# Patient Record
Sex: Male | Born: 1975 | Race: Asian | Hispanic: No | Marital: Married | Smoking: Never smoker
Health system: Southern US, Community
[De-identification: ages and names within clinical notes are randomized; demographics above are authoritative.]

## PROBLEM LIST (undated history)

## (undated) DIAGNOSIS — E785 Hyperlipidemia, unspecified: Secondary | ICD-10-CM

## (undated) DIAGNOSIS — Z8639 Personal history of other endocrine, nutritional and metabolic disease: Secondary | ICD-10-CM

## (undated) HISTORY — DX: Hyperlipidemia, unspecified: E78.5

## (undated) HISTORY — DX: Personal history of other endocrine, nutritional and metabolic disease: Z86.39

---

## 2013-04-23 DIAGNOSIS — Z8639 Personal history of other endocrine, nutritional and metabolic disease: Secondary | ICD-10-CM

## 2013-04-23 HISTORY — DX: Personal history of other endocrine, nutritional and metabolic disease: Z86.39

## 2013-05-15 LAB — HEPATIC FUNCTION PANEL
ALT: 17 U/L (ref 10–40)
AST: 23 U/L (ref 14–40)
Alkaline Phosphatase: 77 U/L (ref 25–125)
BILIRUBIN, TOTAL: 0.6 mg/dL

## 2013-05-15 LAB — LIPID PANEL
Cholesterol: 220 mg/dL — AB (ref 0–200)
HDL: 60 mg/dL (ref 35–70)
LDL CALC: 150 mg/dL
TRIGLYCERIDES: 52 mg/dL (ref 40–160)

## 2013-05-15 LAB — BASIC METABOLIC PANEL
BUN: 23 mg/dL — AB (ref 4–21)
Creatinine: 1.1 mg/dL (ref <=1.3)
Glucose: 94 mg/dL
Potassium: 5.1 mmol/L (ref 3.4–5.3)
SODIUM: 140 mmol/L (ref 137–147)

## 2013-05-15 LAB — CBC AND DIFFERENTIAL
HEMATOCRIT: 45 % (ref 41–53)
Hemoglobin: 14.8 g/dL (ref 13.5–17.5)
PLATELETS: 170 10*3/uL (ref 150–399)
WBC: 6.6 10^3/mL

## 2013-05-15 LAB — VITAMIN D 25 HYDROXY (VIT D DEFICIENCY, FRACTURES): Vit D, 25-Hydroxy: 17.2

## 2013-05-15 LAB — TSH: TSH: 0.83 u[IU]/mL (ref <=5.90)

## 2015-07-26 DIAGNOSIS — E785 Hyperlipidemia, unspecified: Secondary | ICD-10-CM | POA: Insufficient documentation

## 2016-09-11 ENCOUNTER — Encounter: Payer: Self-pay | Admitting: *Deleted

## 2016-09-11 ENCOUNTER — Other Ambulatory Visit: Payer: Self-pay | Admitting: *Deleted

## 2016-09-13 ENCOUNTER — Encounter: Payer: Self-pay | Admitting: Family Medicine

## 2016-09-14 ENCOUNTER — Ambulatory Visit (INDEPENDENT_AMBULATORY_CARE_PROVIDER_SITE_OTHER): Payer: 59 | Admitting: Family Medicine

## 2016-09-14 ENCOUNTER — Encounter: Payer: Self-pay | Admitting: Family Medicine

## 2016-09-14 VITALS — BP 103/67 | HR 68 | Temp 97.9°F | Resp 16 | Ht 69.5 in | Wt 166.8 lb

## 2016-09-14 DIAGNOSIS — D3610 Benign neoplasm of peripheral nerves and autonomic nervous system, unspecified: Secondary | ICD-10-CM

## 2016-09-14 NOTE — Progress Notes (Signed)
Office Note 09/14/2016  CC:  Chief Complaint  Patient presents with  . Establish Care  . Cyst    ? on chest and side per pt   HPI:  Bob Bennett is a 41 y.o. male who is here to establish care and discuss question of cyst on chest and side. Patient's most recent primary MD: Dr. Tammi Klippel at [facility] in [place]. Old records were reviewed (some old labs) prior to or during today's visit.  In 2014-2015 he took about 1 mo of daily vit D--unknown dose-- then stopped.  Never got it rechecked.  He feels a hard bump at xyphoid process --more the last month than prior. Also, mildly firm bump felt in L side/thoracic level noted about 2-3 weeks ago.  No pain.    Past Medical History:  Diagnosis Date  . History of vitamin D deficiency   . Hyperlipidemia     History reviewed. No pertinent surgical history.  Family History  Problem Relation Age of Onset  . Diabetes Father   . Cancer Neg Hx   . Heart disease Neg Hx   . Stroke Neg Hx     Social History   Social History  . Marital status: Married    Spouse name: N/A  . Number of children: N/A  . Years of education: N/A   Occupational History  . Not on file.   Social History Main Topics  . Smoking status: Never Smoker  . Smokeless tobacco: Never Used  . Alcohol use Yes     Comment: occasionally  . Drug use: No  . Sexual activity: Not on file   Other Topics Concern  . Not on file   Social History Narrative   Married, 1 son and 1 daughter.   Orig from [place].  Moved to [place] in 2000.   Educ: Masters degree in Chief Financial Officer.   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   No tobacco.  No drugs.   Alcohol: occasional.    MEDS: none  No Known Allergies  ROS Review of Systems  Constitutional: Negative for fatigue and fever.  HENT: Negative for congestion and sore throat.   Eyes: Negative for visual disturbance.  Respiratory: Negative for cough.   Cardiovascular: Negative for chest pain.  Gastrointestinal:  Negative for abdominal pain and nausea.  Genitourinary: Negative for dysuria.  Musculoskeletal: Negative for back pain and joint swelling.  Skin: Negative for rash.  Neurological: Negative for weakness and headaches.  Hematological: Negative for adenopathy.    PE; Blood pressure 103/67, pulse 68, temperature 97.9 F (36.6 C), temperature source Oral, resp. rate 16, height 5' 9.5" (1.765 m), weight 166 lb 12 oz (75.6 kg), SpO2 98 %. Gen: Alert, well appearing.  Patient is oriented to person, place, time, and situation. AFFECT: pleasant, lucid thought and speech. EKC:MKLK: no injection, icteris, swelling, or exudate.  EOMI, PERRLA. Mouth: lips without lesion/swelling.  Oral mucosa pink and moist. Oropharynx without erythema, exudate, or swelling.  CV: RRR, no m/r/g.   LUNGS: CTA bilat, nonlabored resps, good aeration in all lung fields. Chest wall: xyphoid process is palpable, nontender. Left posterolateral chest wall, overlying inferior-most rib is a 1-2 cm soft tissue nodule that feels like it is in the fatty layer and is rubbery consistency and freely moveable, non-tender.  No overlying skin changes.  Pertinent labs:  none  ASSESSMENT AND PLAN:   New pt; pertinent old records reviewed.  1) Xiphoid "nodule"--reassured patient that this was his xyphoid process/normal anatomy, not a cyst, nodule,  or mass.  2) Soft tissue nodule--left posterolateral chest wall.  Suspect benign--? Neuroma, ? Sebaceous cyst.  Doubt lipoma. Reassured pt--watchful waiting approach.  3) Hx of vit D deficiency and mild hyperlipidemia : we'll recheck vit D level with fasting health panel labs at upcoming CPE.  An After Visit Summary was printed and given to the patient.  Return for keep appt for CPE (fasting) 09/19/16.  Signed:  Crissie Sickles, MD           09/14/2016

## 2016-09-19 ENCOUNTER — Encounter: Payer: Self-pay | Admitting: Family Medicine

## 2016-10-08 ENCOUNTER — Encounter: Payer: Self-pay | Admitting: Family Medicine

## 2016-10-30 ENCOUNTER — Encounter: Payer: Self-pay | Admitting: Family Medicine

## 2016-10-30 ENCOUNTER — Encounter: Payer: 59 | Admitting: Family Medicine

## 2016-10-30 ENCOUNTER — Encounter: Payer: Self-pay | Admitting: *Deleted

## 2016-10-30 NOTE — Progress Notes (Deleted)
Office Note 10/30/2016  CC: No chief complaint on file.   HPI:  Bob Bennett is a 41 y.o.  male who is here for annual health maintenance exam.    Past Medical History:  Diagnosis Date  . History of vitamin D deficiency 2015  . Hyperlipidemia    Mild    No past surgical history on file.  Family History  Problem Relation Age of Onset  . Diabetes Father   . Cancer Neg Hx   . Heart disease Neg Hx   . Stroke Neg Hx     Social History   Social History  . Marital status: Married    Spouse name: N/A  . Number of children: N/A  . Years of education: N/A   Occupational History  . Not on file.   Social History Main Topics  . Smoking status: Never Smoker  . Smokeless tobacco: Never Used  . Alcohol use Yes     Comment: occasionally  . Drug use: No  . Sexual activity: Not on file   Other Topics Concern  . Not on file   Social History Narrative   Married, 1 son and 1 daughter.   Orig from [place].  Moved to [place] in 2000.   Educ: Masters degree in Chief Financial Officer.   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   No tobacco.  No drugs.   Alcohol: occasional.    No outpatient prescriptions prior to visit.   No facility-administered medications prior to visit.     No Known Allergies  ROS *** PE; There were no vitals taken for this visit. *** Pertinent labs:  ***  ASSESSMENT AND PLAN:   No problem-specific Assessment & Plan notes found for this encounter.   FOLLOW UP:  No Follow-up on file.

## 2016-11-30 ENCOUNTER — Ambulatory Visit (INDEPENDENT_AMBULATORY_CARE_PROVIDER_SITE_OTHER): Payer: 59 | Admitting: Family Medicine

## 2016-11-30 ENCOUNTER — Encounter: Payer: Self-pay | Admitting: Family Medicine

## 2016-11-30 VITALS — BP 92/58 | HR 70 | Temp 97.9°F | Resp 16 | Ht 69.5 in | Wt 166.5 lb

## 2016-11-30 DIAGNOSIS — Z131 Encounter for screening for diabetes mellitus: Secondary | ICD-10-CM | POA: Diagnosis not present

## 2016-11-30 DIAGNOSIS — Z0000 Encounter for general adult medical examination without abnormal findings: Secondary | ICD-10-CM

## 2016-11-30 LAB — CBC WITH DIFFERENTIAL/PLATELET
BASOS ABS: 0 10*3/uL (ref 0.0–0.1)
BASOS PCT: 0.7 % (ref 0.0–3.0)
EOS PCT: 0.6 % (ref 0.0–5.0)
Eosinophils Absolute: 0 10*3/uL (ref 0.0–0.7)
HEMATOCRIT: 42.4 % (ref 39.0–52.0)
Hemoglobin: 13.8 g/dL (ref 13.0–17.0)
LYMPHS ABS: 1.6 10*3/uL (ref 0.7–4.0)
LYMPHS PCT: 26.3 % (ref 12.0–46.0)
MCHC: 32.6 g/dL (ref 30.0–36.0)
MCV: 90.6 fl (ref 78.0–100.0)
MONOS PCT: 7.9 % (ref 3.0–12.0)
Monocytes Absolute: 0.5 10*3/uL (ref 0.1–1.0)
NEUTROS ABS: 3.9 10*3/uL (ref 1.4–7.7)
NEUTROS PCT: 64.5 % (ref 43.0–77.0)
PLATELETS: 206 10*3/uL (ref 150.0–400.0)
RBC: 4.68 Mil/uL (ref 4.22–5.81)
RDW: 13.6 % (ref 11.5–15.5)
WBC: 6.1 10*3/uL (ref 4.0–10.5)

## 2016-11-30 LAB — COMPREHENSIVE METABOLIC PANEL
ALT: 33 U/L (ref 0–53)
AST: 40 U/L — AB (ref 0–37)
Albumin: 4.2 g/dL (ref 3.5–5.2)
Alkaline Phosphatase: 55 U/L (ref 39–117)
BUN: 21 mg/dL (ref 6–23)
CALCIUM: 9.3 mg/dL (ref 8.4–10.5)
CHLORIDE: 104 meq/L (ref 96–112)
CO2: 30 meq/L (ref 19–32)
CREATININE: 1.07 mg/dL (ref 0.40–1.50)
GFR: 81.32 mL/min (ref >60.00)
GLUCOSE: 94 mg/dL (ref 70–99)
Potassium: 4.5 mEq/L (ref 3.5–5.1)
Sodium: 137 mEq/L (ref 135–145)
Total Bilirubin: 1 mg/dL (ref 0.2–1.2)
Total Protein: 6.7 g/dL (ref 6.0–8.3)

## 2016-11-30 LAB — TSH: TSH: 0.82 u[IU]/mL (ref 0.35–4.50)

## 2016-11-30 LAB — LIPID PANEL
CHOL/HDL RATIO: 4
Cholesterol: 205 mg/dL — ABNORMAL HIGH (ref 0–200)
HDL: 45.7 mg/dL (ref >39.00)
LDL CALC: 142 mg/dL — AB (ref 0–99)
NONHDL: 159.15
TRIGLYCERIDES: 85 mg/dL (ref 0.0–149.0)
VLDL: 17 mg/dL (ref 0.0–40.0)

## 2016-11-30 LAB — HEMOGLOBIN A1C: Hgb A1c MFr Bld: 5.8 % (ref 4.6–6.5)

## 2016-11-30 NOTE — Progress Notes (Signed)
Office Note 11/30/2016  CC:  Chief Complaint  Patient presents with  . Annual Exam    Pt is fasting.    HPI:  Bob Bennett is a 41 y.o. male who is here for annual health maintenance exam. Has biometrics form for his employer that he needs to have me complete when his lab results return. He is feeling well and has no acute complaints.  Eyes: had exam about 10 yrs ago. Dental: last preventative exam was 2 yrs ago. Exercise: started crossfit workouts recently. Diet: tries to eat varied food choices, limits portion size, tries to reduce carbs.   Past Medical History:  Diagnosis Date  . History of vitamin D deficiency 2015  . Hyperlipidemia    Mild    History reviewed. No pertinent surgical history.  Family History  Problem Relation Age of Onset  . Diabetes Father   . Cancer Neg Hx   . Heart disease Neg Hx   . Stroke Neg Hx     Social History   Social History  . Marital status: Married    Spouse name: N/A  . Number of children: N/A  . Years of education: N/A   Occupational History  . Not on file.   Social History Main Topics  . Smoking status: Never Smoker  . Smokeless tobacco: Never Used  . Alcohol use Yes     Comment: occasionally  . Drug use: No  . Sexual activity: Not on file   Other Topics Concern  . Not on file   Social History Narrative   Married, 1 son and 1 daughter.   Orig from [place].  Moved to [place] in 2000.   Educ: Masters degree in Chief Financial Officer.   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   No tobacco.  No drugs.   Alcohol: occasional.    MEDS; none  No Known Allergies  ROS Review of Systems  Constitutional: Negative for appetite change, chills, fatigue and fever.  HENT: Negative for congestion, dental problem, ear pain and sore throat.   Eyes: Negative for discharge, redness and visual disturbance.  Respiratory: Negative for cough, chest tightness, shortness of breath and wheezing.   Cardiovascular: Negative for chest pain,  palpitations and leg swelling.  Gastrointestinal: Negative for abdominal pain, blood in stool, diarrhea, nausea and vomiting.  Genitourinary: Negative for difficulty urinating, dysuria, flank pain, frequency, hematuria and urgency.  Musculoskeletal: Negative for arthralgias, back pain, joint swelling, myalgias and neck stiffness.  Skin: Negative for pallor and rash.  Neurological: Negative for dizziness, speech difficulty, weakness and headaches.  Hematological: Negative for adenopathy. Does not bruise/bleed easily.  Psychiatric/Behavioral: Negative for confusion and sleep disturbance. The patient is not nervous/anxious.     PE; Blood pressure (!) 92/58, pulse 70, temperature 97.9 F (36.6 C), temperature source Oral, resp. rate 16, height 5' 9.5" (1.765 m), weight 166 lb 8 oz (75.5 kg), SpO2 98 %. Body mass index is 24.24 kg/m.  Gen: Alert, well appearing.  Patient is oriented to person, place, time, and situation. AFFECT: pleasant, lucid thought and speech. ENT: Ears: EACs clear, normal epithelium.  TMs with good light reflex and landmarks bilaterally.  Eyes: no injection, icteris, swelling, or exudate.  EOMI, PERRLA. Nose: no drainage or turbinate edema/swelling.  No injection or focal lesion.  Mouth: lips without lesion/swelling.  Oral mucosa pink and moist.  Dentition intact and without obvious caries or gingival swelling.  Oropharynx without erythema, exudate, or swelling.  Neck: supple/nontender.  No LAD, mass, or TM.  Carotid  pulses 2+ bilaterally, without bruits. CV: RRR, no m/r/g.   LUNGS: CTA bilat, nonlabored resps, good aeration in all lung fields. ABD: soft, NT, ND, BS normal.  No hepatospenomegaly or mass.  No bruits. EXT: no clubbing, cyanosis, or edema.  Musculoskeletal: no joint swelling, erythema, warmth, or tenderness.  ROM of all joints intact. Skin - no sores or suspicious lesions or rashes or color changes  Pertinent labs:  Lab Results  Component Value Date   TSH  0.83 05/15/2013   Lab Results  Component Value Date   WBC 6.6 05/15/2013   HGB 14.8 05/15/2013   HCT 45 05/15/2013   PLT 170 05/15/2013   Lab Results  Component Value Date   CREATININE 1.1 05/15/2013   BUN 23 (A) 05/15/2013   NA 140 05/15/2013   K 5.1 05/15/2013   Lab Results  Component Value Date   ALT 17 05/15/2013   AST 23 05/15/2013   ALKPHOS 77 05/15/2013   Lab Results  Component Value Date   CHOL 220 (A) 05/15/2013   Lab Results  Component Value Date   HDL 60 05/15/2013   Lab Results  Component Value Date   LDLCALC 150 05/15/2013   Lab Results  Component Value Date   TRIG 52 05/15/2013    ASSESSMENT AND PLAN:   Health maintenance exam: Reviewed age and gender appropriate health maintenance issues (prudent diet, regular exercise, health risks of tobacco and excessive alcohol, use of seatbelts, fire alarms in home, use of sunscreen).  Also reviewed age and gender appropriate health screening as well as vaccine recommendations. Vaccines: he declined Tdap today. Labs: fasting HP today + hb a1c as required by his employer for biometrics screening.  An After Visit Summary was printed and given to the patient.  FOLLOW UP:  Return in about 1 year (around 11/30/2017) for annual CPE (fasting).  Signed:  Crissie Sickles, MD           11/30/2016

## 2016-11-30 NOTE — Patient Instructions (Signed)

## 2017-06-11 ENCOUNTER — Ambulatory Visit (INDEPENDENT_AMBULATORY_CARE_PROVIDER_SITE_OTHER): Payer: 59 | Admitting: Family Medicine

## 2017-06-11 ENCOUNTER — Encounter: Payer: Self-pay | Admitting: Family Medicine

## 2017-06-11 VITALS — BP 102/64 | HR 81 | Temp 98.4°F | Resp 16 | Ht 69.5 in | Wt 164.0 lb

## 2017-06-11 DIAGNOSIS — R768 Other specified abnormal immunological findings in serum: Secondary | ICD-10-CM

## 2017-06-11 NOTE — Progress Notes (Signed)
OFFICE VISIT  06/11/2017   CC:  Chief Complaint  Patient presents with  . Exposure to STD    ?    HPI:    Patient is a 42 y.o.  male who presents for concern about syphilis. On 05/08/17 [facility], testing on his blood showed + RPR but NEG FTA confirmatory testing. He denies any known exposure to any STD, nor does he have past hx of unprotected sex. He has been sexually active exclusively with his wife and does not use protection.  Says wife donated blood approx 2 yrs ago and no positive testing showed up on her blood.  Denies any hx of penile/GU lesion.  No hx of unexplained rash.  I last saw him 11/2016 for health maintenance exam.  He was doing well, routine HP labs + HbA1c all normal.  Past Medical History:  Diagnosis Date  . History of vitamin D deficiency 2015  . Hyperlipidemia    Mild   Social History   Socioeconomic History  . Marital status: Married    Spouse name: None  . Number of children: None  . Years of education: None  . Highest education level: None  Social Needs  . Financial resource strain: None  . Food insecurity - worry: None  . Food insecurity - inability: None  . Transportation needs - medical: None  . Transportation needs - non-medical: None  Occupational History  . None  Tobacco Use  . Smoking status: Never Smoker  . Smokeless tobacco: Never Used  Substance and Sexual Activity  . Alcohol use: Yes    Comment: occasionally  . Drug use: No  . Sexual activity: None  Other Topics Concern  . None  Social History Narrative   Married, 1 son and 1 daughter.   Orig from [place].  Moved to [place] in 2000.   Educ: Masters degree in Chief Financial Officer.   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   No tobacco.  No drugs.   Alcohol: occasional.    History reviewed. No pertinent surgical history.  No outpatient medications prior to visit.   No facility-administered medications prior to visit.     No Known Allergies  ROS As per  HPI  PE: Blood pressure 102/64, pulse 81, temperature 98.4 F (36.9 C), temperature source Oral, resp. rate 16, height 5' 9.5" (1.765 m), weight 164 lb (74.4 kg), SpO2 97 %. Gen: Alert, well appearing.  Patient is oriented to person, place, time, and situation. AFFECT: pleasant, lucid thought and speech. No further exam today.  LABS:  None today.  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 Will repeat RPR and FTA testing here today. Pt very low risk for this dz. Reassurance given today.  An After Visit Summary was printed and given to the patient.  FOLLOW UP: Return for as needed.  Signed:  Crissie Sickles, MD           06/11/2017

## 2017-06-12 LAB — RPR: RPR: NONREACTIVE

## 2017-06-18 ENCOUNTER — Encounter: Payer: Self-pay | Admitting: *Deleted

## 2017-09-05 ENCOUNTER — Telehealth: Payer: Self-pay | Admitting: Family Medicine

## 2017-09-05 NOTE — Telephone Encounter (Signed)
Patient dropped "pre-participation physical" form off requesting it be completed by pcp.  Patient has been advised pcp out of office until next week.  Patient states this is ok as form is not due until end of the month.   Form placed in Dr. Idelle Leech folder at the front desk.

## 2017-09-05 NOTE — Telephone Encounter (Signed)
Pt called back. Pt stated that he would like to use the 11/30/16 visit to complete his health form.   Form placed on Dr. Idelle Leech desk for review.

## 2017-09-05 NOTE — Telephone Encounter (Addendum)
Left message for pt to call back.   Need to know if pt is planing on coming in for a CPE or does he want [place] to fill out the form based off his CPE done 11/30/16?  If pts insurance is still [employer] he can schedule a CPE anytime does not have to be a year from last CPE.

## 2017-09-10 NOTE — Telephone Encounter (Signed)
Form completed.

## 2017-09-10 NOTE — Telephone Encounter (Signed)
Copy made for chart.   Original put up front for pick up.   Left detailed message on home vm, okay per DPR.

## 2017-11-26 ENCOUNTER — Encounter: Payer: Self-pay | Admitting: Family Medicine

## 2017-11-26 ENCOUNTER — Ambulatory Visit (INDEPENDENT_AMBULATORY_CARE_PROVIDER_SITE_OTHER): Payer: 59 | Admitting: Family Medicine

## 2017-11-26 VITALS — BP 121/73 | HR 88 | Temp 99.2°F | Resp 20 | Ht 69.5 in | Wt 165.0 lb

## 2017-11-26 DIAGNOSIS — J40 Bronchitis, not specified as acute or chronic: Secondary | ICD-10-CM

## 2017-11-26 DIAGNOSIS — R509 Fever, unspecified: Secondary | ICD-10-CM | POA: Diagnosis not present

## 2017-11-26 DIAGNOSIS — R591 Generalized enlarged lymph nodes: Secondary | ICD-10-CM

## 2017-11-26 DIAGNOSIS — R059 Cough, unspecified: Secondary | ICD-10-CM

## 2017-11-26 DIAGNOSIS — R05 Cough: Secondary | ICD-10-CM

## 2017-11-26 LAB — CBC WITH DIFFERENTIAL/PLATELET
Basophils Absolute: 0 10*3/uL (ref 0.0–0.1)
Basophils Relative: 0.6 % (ref 0.0–3.0)
EOS PCT: 0.6 % (ref 0.0–5.0)
Eosinophils Absolute: 0 10*3/uL (ref 0.0–0.7)
HCT: 44.8 % (ref 39.0–52.0)
Hemoglobin: 15.2 g/dL (ref 13.0–17.0)
LYMPHS ABS: 1.2 10*3/uL (ref 0.7–4.0)
Lymphocytes Relative: 18.9 % (ref 12.0–46.0)
MCHC: 33.8 g/dL (ref 30.0–36.0)
MCV: 89.1 fl (ref 78.0–100.0)
MONO ABS: 0.9 10*3/uL (ref 0.1–1.0)
MONOS PCT: 13.6 % — AB (ref 3.0–12.0)
NEUTROS ABS: 4.3 10*3/uL (ref 1.4–7.7)
NEUTROS PCT: 66.3 % (ref 43.0–77.0)
PLATELETS: 193 10*3/uL (ref 150.0–400.0)
RBC: 5.03 Mil/uL (ref 4.22–5.81)
RDW: 13.6 % (ref 11.5–15.5)
WBC: 6.5 10*3/uL (ref 4.0–10.5)

## 2017-11-26 LAB — POCT RAPID STREP A (OFFICE): Rapid Strep A Screen: NEGATIVE

## 2017-11-26 MED ORDER — DOXYCYCLINE HYCLATE 100 MG PO TABS: 100.0000 mg | ORAL_TABLET | Freq: Two times a day (BID) | ORAL | 0 refills | Status: AC

## 2017-11-26 MED ORDER — BENZONATATE 200 MG PO CAPS: 200.0000 mg | ORAL_CAPSULE | Freq: Three times a day (TID) | ORAL | 0 refills | Status: AC | PRN

## 2017-11-26 NOTE — Patient Instructions (Signed)
Rest, hydrate.  + flonase, mucinex (DM if cough) doxycyline prescribed, take until completed.  If cough present it can last up to 6-8 weeks.  F/U 2 weeks of not improved Advil and/or  Tylenol for aches and fevers  Right now, We will call it bronchitis treatment. However, I do want to rule out Pneumonia with such a high temperature and fatigue.    Acute Bronchitis, Adult Acute bronchitis is when air tubes (bronchi) in the lungs suddenly get swollen. The condition can make it hard to breathe. It can also cause these symptoms:  A cough.  Coughing up clear, yellow, or green mucus.  Wheezing.  Chest congestion.  Shortness of breath.  A fever.  Body aches.  Chills.  A sore throat.  Follow these instructions at home: Medicines  Take over-the-counter and prescription medicines only as told by your doctor.  If you were prescribed an antibiotic medicine, take it as told by your doctor. Do not stop taking the antibiotic even if you start to feel better. General instructions  Rest.  Drink enough fluids to keep your pee (urine) clear or pale yellow.  Avoid smoking and secondhand smoke. If you smoke and you need help quitting, ask your doctor. Quitting will help your lungs heal faster.  Use an inhaler, cool mist vaporizer, or humidifier as told by your doctor.  Keep all follow-up visits as told by your doctor. This is important. How is this prevented? To lower your risk of getting this condition again:  Wash your hands often with soap and water. If you cannot use soap and water, use hand sanitizer.  Avoid contact with people who have cold symptoms.  Try not to touch your hands to your mouth, nose, or eyes.  Make sure to get the flu shot every year.  Contact a doctor if:  Your symptoms do not get better in 2 weeks. Get help right away if:  You cough up blood.  You have chest pain.  You have very bad shortness of breath.  You become dehydrated.  You faint (pass  out) or keep feeling like you are going to pass out.  You keep throwing up (vomiting).  You have a very bad headache.  Your fever or chills gets worse. This information is not intended to replace advice given to you by your health care provider. Make sure you discuss any questions you have with your health care provider. Document Released: 09/26/2007 Document Revised: 11/16/2015 Document Reviewed: 09/28/2015 Elsevier Interactive Patient Education  [employer].

## 2017-11-26 NOTE — Progress Notes (Signed)
Bob Bennett , Jun 26, 1975, 42 y.o., male MRN: [id number] Patient Care Team    Relationship Specialty Notifications Start End  McGowen, Adrian Blackwater, MD PCP - General Family Medicine  09/14/16     Chief Complaint  Patient presents with  . Fever    fatigue,cough     Subjective: Pt presents for an OV with complaints of fatigue-cough of 4 days duration.  Associated symptoms include fever tmax of 104 F. Pt reports he has been taking tylenol every 6 hours. He endorses a non-productive cough, increase phlegm prdx, body aches, chills and  Mild abd discomfort without bowel changes.  He reports he is able to eat and drink well.  He denies headache, sore throat, sinus pressure, ear pain, rash or shortness of breath.  He has not been around any sick contacts and no recent travel. He states his wife was ill with bronchitis like symptoms, but that was over a month ago.   Depression screen PHQ 2/9 09/14/2016  Decreased Interest 0  Down, Depressed, Hopeless 0  PHQ - 2 Score 0    No Known Allergies Social History   Tobacco Use  . Smoking status: Never Smoker  . Smokeless tobacco: Never Used  Substance Use Topics  . Alcohol use: Yes    Comment: occasionally   Past Medical History:  Diagnosis Date  . History of vitamin D deficiency 2015  . Hyperlipidemia    Mild   History reviewed. No pertinent surgical history. Family History  Problem Relation Age of Onset  . Diabetes Father   . Cancer Neg Hx   . Heart disease Neg Hx   . Stroke Neg Hx    Allergies as of 11/26/2017   No Known Allergies     Medication List        Accurate as of 11/26/17  6:16 PM. Always use your most recent med list.          benzonatate 200 MG capsule Commonly known as:  TESSALON Take 1 capsule (200 mg total) by mouth 3 (three) times daily as needed for cough.   doxycycline 100 MG tablet Commonly known as:  VIBRA-TABS Take 1 tablet (100 mg total) by mouth 2 (two) times daily.       All past medical  history, surgical history, allergies, family history, immunizations andmedications were updated in the EMR today and reviewed under the history and medication portions of their EMR.     ROS: Negative, with the exception of above mentioned in HPI   Objective:  BP 121/73 (BP Location: Right Arm, Patient Position: Sitting, Cuff Size: Normal)   Pulse 88   Temp 99.2 F (37.3 C)   Resp 20   Ht 5' 9.5" (1.765 m)   Wt 165 lb (74.8 kg)   SpO2 97%   BMI 24.02 kg/m  Body mass index is 24.02 kg/m. Gen: Afebrile. No acute distress. Nontoxic in appearance, well developed, well nourished.  HENT: AT. [place]. Bilateral TM visualized w/out erythema or fullness. MMM, no oral lesions. Bilateral nares with mild erythema, no drainage or swelling. Throat without erythema or exudates. Cough and hoarseness present. Moderate phlegm present.   Eyes:Pupils Equal Round Reactive to light, Extraocular movements intact,  Conjunctiva without redness, discharge or icterus. Neck/lymp/endocrine: Supple,left anterior cervical  Lymphadenopathy present CV: RRR  Chest: CTAB, no wheeze or crackles. Good air movement, normal resp effort.  Abd: Soft.NTND. BS presnet. no Masses palpated. No rebound or guarding.  Skin: no rashes, purpura or petechiae.  Neuro:  Normal gait. PERLA. EOMi. Alert. Oriented x3  Psych: Normal affect, dress and demeanor. Normal speech. Normal thought content and judgment.  No exam data present No results found. Results for orders placed or performed in visit on 11/26/17 (from the past 24 hour(s))  POCT rapid strep A     Status: Normal   Collection Time: 11/26/17  2:20 PM  Result Value Ref Range   Rapid Strep A Screen Negative Negative  CBC w/Diff     Status: Abnormal   Collection Time: 11/26/17  2:32 PM  Result Value Ref Range   WBC 6.5 4.0 - 10.5 K/uL   RBC 5.03 4.22 - 5.81 Mil/uL   Hemoglobin 15.2 13.0 - 17.0 g/dL   HCT 44.8 39.0 - 52.0 %   MCV 89.1 78.0 - 100.0 fl   MCHC 33.8 30.0 - 36.0 g/dL    RDW 13.6 11.5 - 15.5 %   Platelets 193.0 150.0 - 400.0 K/uL   Neutrophils Relative % 66.3 43.0 - 77.0 %   Lymphocytes Relative 18.9 12.0 - 46.0 %   Monocytes Relative 13.6 (H) 3.0 - 12.0 %   Eosinophils Relative 0.6 0.0 - 5.0 %   Basophils Relative 0.6 0.0 - 3.0 %   Neutro Abs 4.3 1.4 - 7.7 K/uL   Lymphs Abs 1.2 0.7 - 4.0 K/uL   Monocytes Absolute 0.9 0.1 - 1.0 K/uL   Eosinophils Absolute 0.0 0.0 - 0.7 K/uL   Basophils Absolute 0.0 0.0 - 0.1 K/uL    Assessment/Plan: Bob Bennett is a 42 y.o. male present for OV for  Fever, unspecified fever cause/cough/lymphadenopathy - uncertain cause, high fever reported (104F)--> low grade fever today with use of tylenol q 6. Strep negative. Exam + for left ant cervical lymph node, cough, hoarseness and increase phlegm prdx. Discussed options today, and decided to treat as bronchitis w/ doxy, obtain CBC and CXR--> mostly 2/2 to high reported fever.  - POCT rapid strep A--> negative - CBC w/Diff - DG Chest 2 View; Future - Upper Respiratory Culture Neg strep--> resp culture sent.  CXR ordered.  Rest, hydrate.  + flonase, mucinex (DM if cough), nettie pot or nasal saline.  doxycyline prescribed, take until completed.  If cough present it can last up to 6-8 weeks.  F/U 2 weeks of not improved.    Reviewed expectations re: course of current medical issues.  Discussed self-management of symptoms.  Outlined signs and symptoms indicating need for more acute intervention.  Patient verbalized understanding and all questions were answered.  Patient received an After-Visit Summary.    Orders Placed This Encounter  Procedures  . Upper Respiratory Culture  . DG Chest 2 View  . CBC w/Diff  . POCT rapid strep A     Note is dictated utilizing voice recognition software. Although note has been proof read prior to signing, occasional typographical errors still can be missed. If any questions arise, please do not hesitate to call for  verification.   electronically signed by:  Howard Pouch, DO  [place]

## 2017-11-27 ENCOUNTER — Telehealth: Payer: Self-pay | Admitting: *Deleted

## 2017-11-27 ENCOUNTER — Ambulatory Visit (HOSPITAL_BASED_OUTPATIENT_CLINIC_OR_DEPARTMENT_OTHER)
Admission: RE | Admit: 2017-11-27 | Discharge: 2017-11-27 | Disposition: A | Discharge status: Home / Self Care | Payer: 59 | Source: Ambulatory Visit | Attending: Family Medicine | Admitting: Family Medicine

## 2017-11-27 DIAGNOSIS — R05 Cough: Secondary | ICD-10-CM | POA: Diagnosis not present

## 2017-11-27 DIAGNOSIS — R509 Fever, unspecified: Secondary | ICD-10-CM | POA: Insufficient documentation

## 2017-11-27 DIAGNOSIS — R059 Cough, unspecified: Secondary | ICD-10-CM

## 2017-11-27 NOTE — Telephone Encounter (Signed)
Called patient to see if he was going to get xray that was ordered yesterday. Patient states he was to dizzy yesterday to go but is going this afternoon to get it done.

## 2017-11-28 ENCOUNTER — Telehealth: Payer: Self-pay | Admitting: Family Medicine

## 2017-11-28 NOTE — Telephone Encounter (Addendum)
Please inform patient the following information: His labs are normal, very mild shift in a cell line called monocytes which can be normal and/or seen with  Early infection. His throat culture is normal. His CXR is normal, no pneumonia signs.  He is treated for bronchitis with doxycyline. This will prevent possible progression to Pneumonia.  It is possible his symptoms are from a viral cause, either way he should see improvement daily and resolution by 10 days.  He requested tamiflu on call back. Please explain to him, CDC updated reports on influenza show < 0.5% chance of influenza currently. Tamiflu would not be indicated.  F/U PCP in 1 week if not improving.

## 2017-11-28 NOTE — Telephone Encounter (Signed)
Left detailed message with results and instructions on patient voice mail per [facility] If patient calls back ok for PEC Triage to review with patient.

## 2017-11-29 LAB — CULTURE, UPPER RESPIRATORY
MICRO NUMBER:: 90929197
SPECIMEN QUALITY:: ADEQUATE

## 2017-12-06 ENCOUNTER — Encounter: Payer: 59 | Admitting: Family Medicine

## 2020-02-14 IMAGING — DX DG CHEST 2V
2 series · 2 of 2 positions shown · non-contrast
Comparison: None.

CLINICAL DATA: Cough and high fevers

EXAM:
CHEST - 2 VIEW

[chest pa]
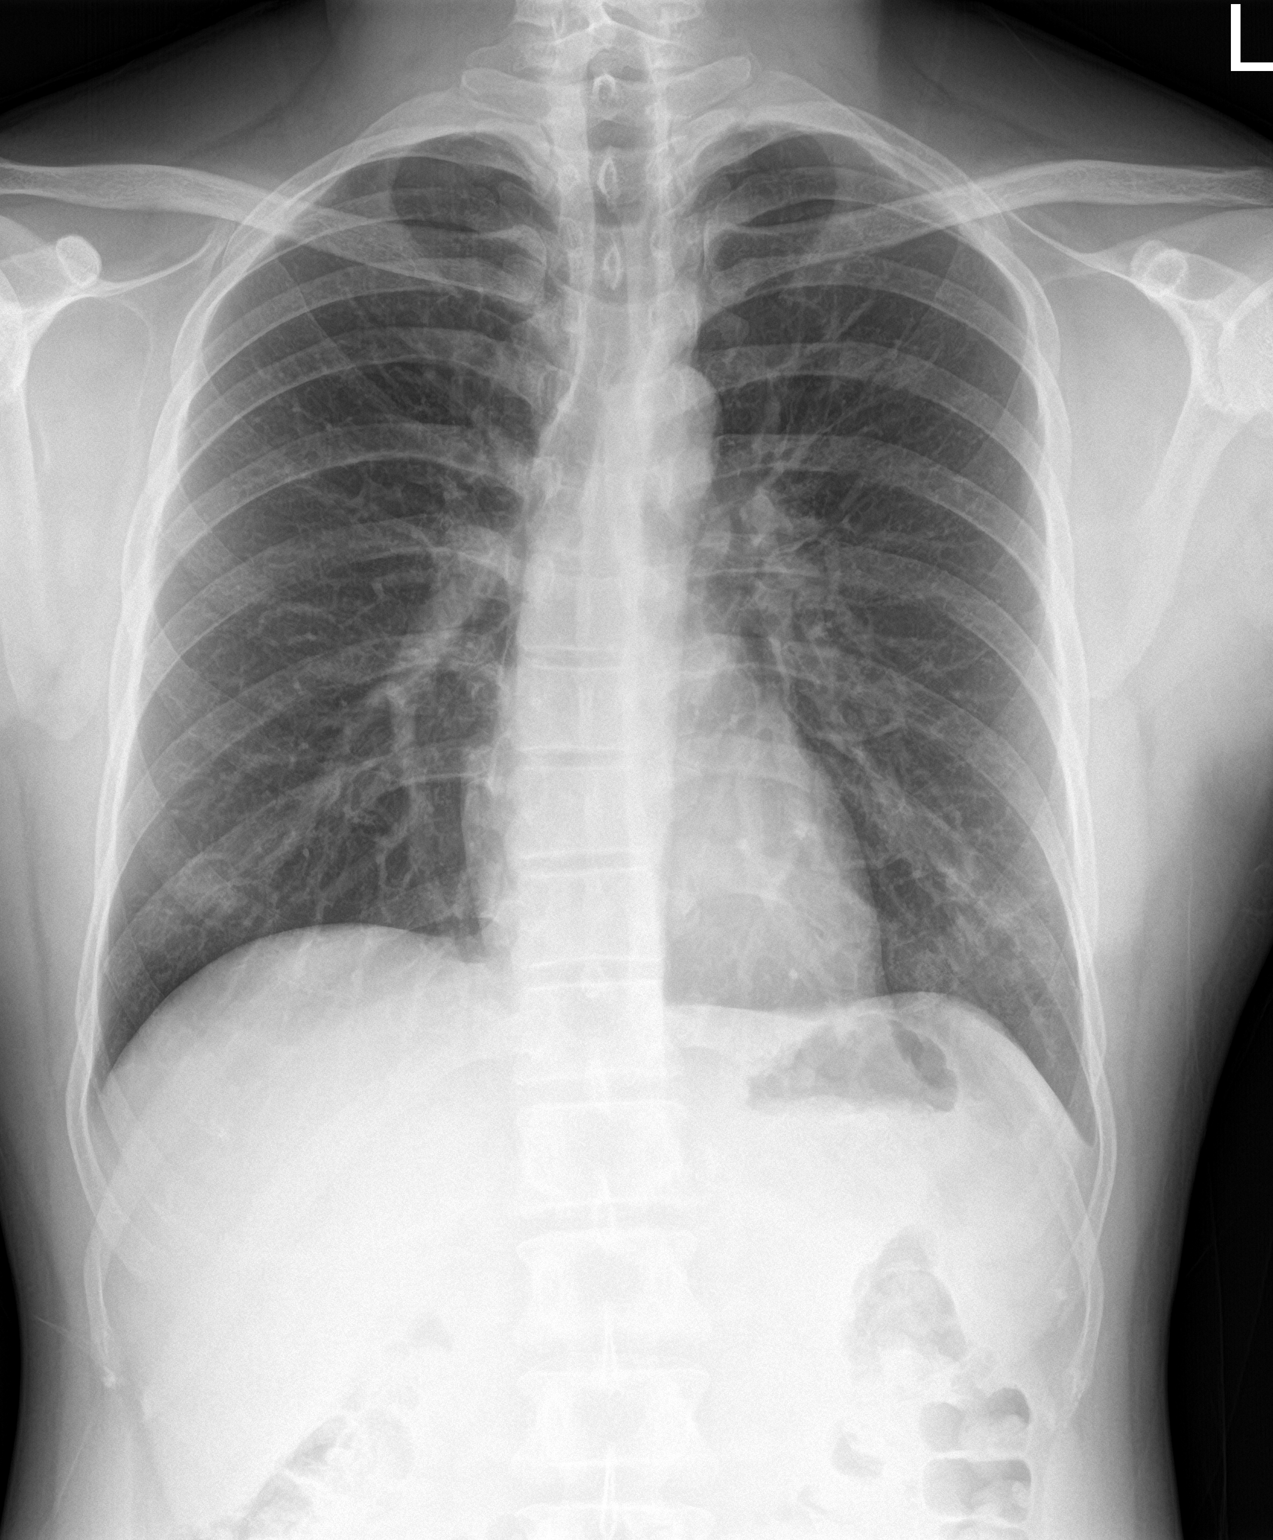

[chest lat]
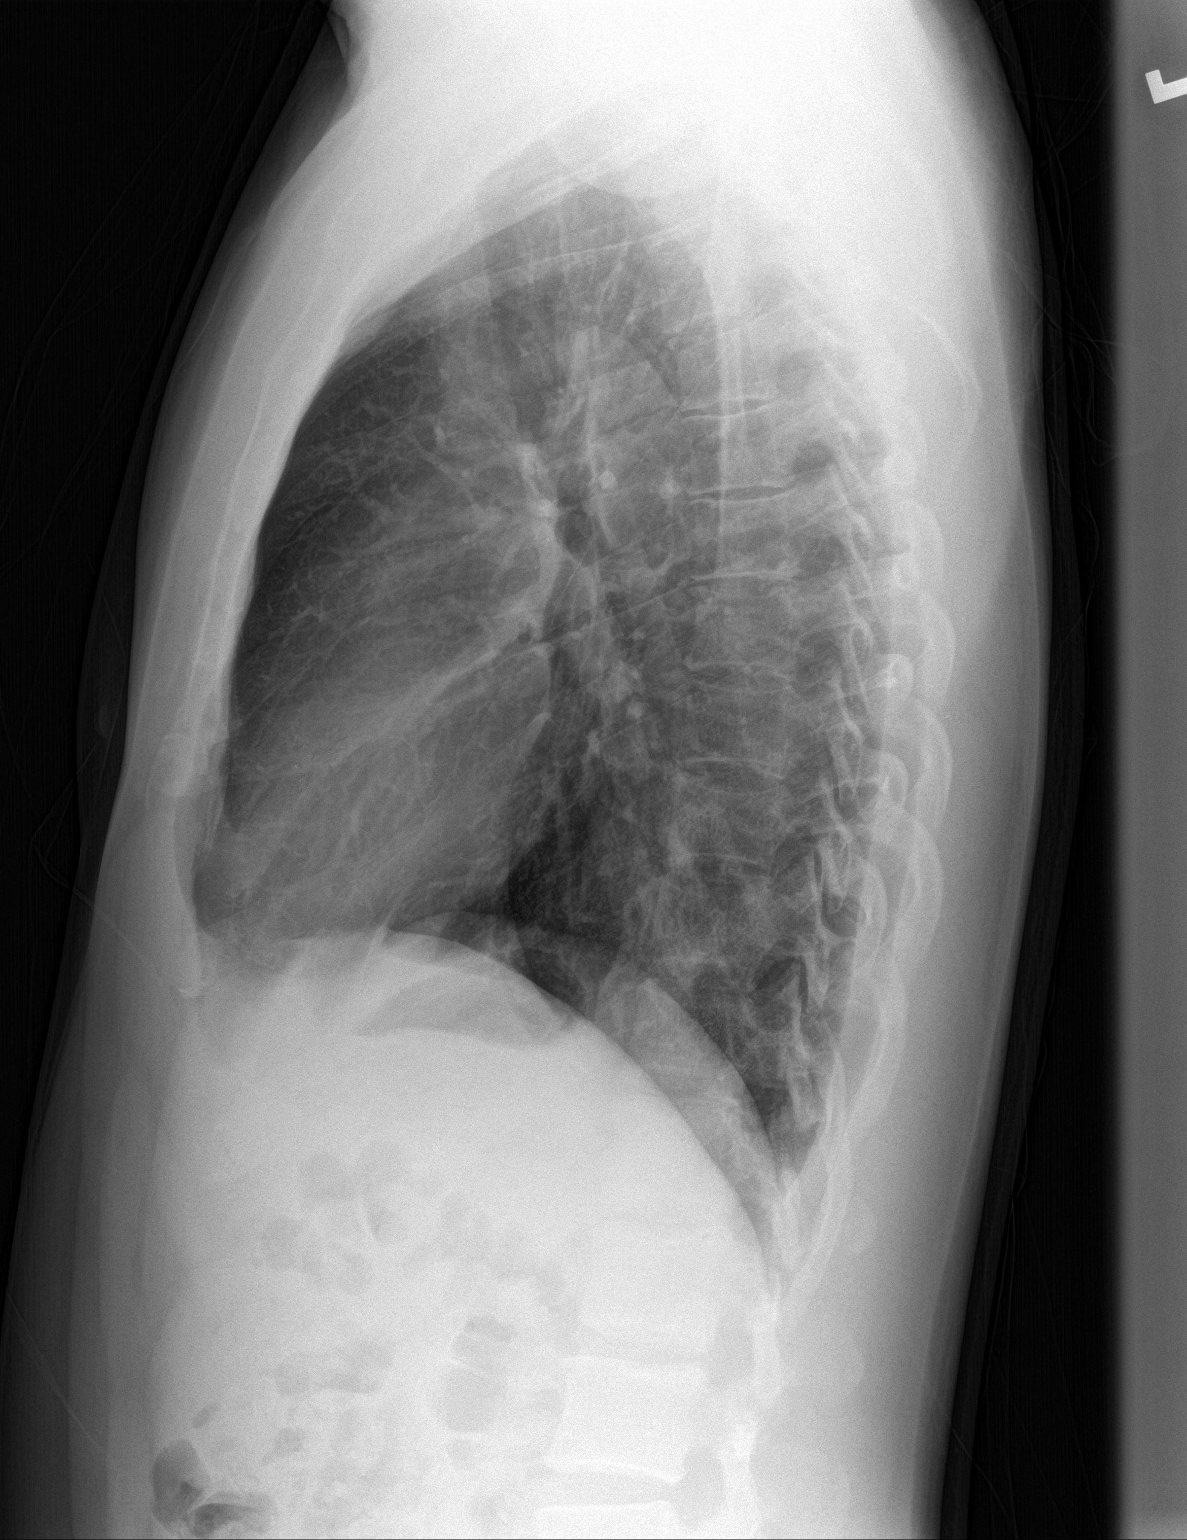

[2 of 2 positions shown; findings below may reference images not displayed]

FINDINGS: The heart size and mediastinal contours are within normal limits.
Both lungs are clear. The visualized skeletal structures are
unremarkable.
IMPRESSION: No active cardiopulmonary disease.
